# Patient Record
Sex: Male | Born: 1985 | Race: Black or African American | Hispanic: No | Marital: Married | Smoking: Current every day smoker
Health system: Southern US, Community
[De-identification: ages and names within clinical notes are randomized; demographics above are authoritative.]

## PROBLEM LIST (undated history)

## (undated) HISTORY — PX: HERNIA REPAIR: SHX51

---

## 2017-05-08 ENCOUNTER — Emergency Department (HOSPITAL_COMMUNITY)
Admission: EM | Admit: 2017-05-08 | Discharge: 2017-05-08 | Disposition: A | Discharge status: Home / Self Care | Payer: Self-pay | Attending: Emergency Medicine | Admitting: Emergency Medicine

## 2017-05-08 ENCOUNTER — Emergency Department (HOSPITAL_COMMUNITY): Payer: Self-pay

## 2017-05-08 ENCOUNTER — Encounter (HOSPITAL_COMMUNITY): Payer: Self-pay

## 2017-05-08 ENCOUNTER — Other Ambulatory Visit: Payer: Self-pay

## 2017-05-08 DIAGNOSIS — F17200 Nicotine dependence, unspecified, uncomplicated: Secondary | ICD-10-CM | POA: Insufficient documentation

## 2017-05-08 DIAGNOSIS — R200 Anesthesia of skin: Secondary | ICD-10-CM | POA: Insufficient documentation

## 2017-05-08 DIAGNOSIS — R079 Chest pain, unspecified: Secondary | ICD-10-CM

## 2017-05-08 LAB — BASIC METABOLIC PANEL
ANION GAP: 11 (ref 5–15)
BUN: 15 mg/dL (ref 6–20)
CALCIUM: 9.4 mg/dL (ref 8.9–10.3)
CHLORIDE: 107 mmol/L (ref 101–111)
CO2: 23 mmol/L (ref 22–32)
Creatinine, Ser: 1.06 mg/dL (ref 0.61–1.24)
GFR calc Af Amer: >60 mL/min (ref >60)
GFR calc non Af Amer: >60 mL/min (ref >60)
Glucose, Bld: 90 mg/dL (ref 65–99)
Potassium: 3.9 mmol/L (ref 3.5–5.1)
Sodium: 141 mmol/L (ref 135–145)

## 2017-05-08 LAB — CBC
HEMATOCRIT: 45.9 % (ref 39.0–52.0)
HEMOGLOBIN: 15.9 g/dL (ref 13.0–17.0)
MCH: 31.4 pg (ref 26.0–34.0)
MCHC: 34.6 g/dL (ref 30.0–36.0)
MCV: 90.5 fL (ref 78.0–100.0)
Platelets: 270 10*3/uL (ref 150–400)
RBC: 5.07 MIL/uL (ref 4.22–5.81)
RDW: 13.1 % (ref 11.5–15.5)
WBC: 5.5 10*3/uL (ref 4.0–10.5)

## 2017-05-08 LAB — I-STAT TROPONIN, ED: Troponin i, poc: 0 ng/mL (ref 0.00–0.08)

## 2017-05-08 NOTE — ED Triage Notes (Signed)
Patient complains of 1 week of intermittent chest pain with radiation to back and left arm. No SOB. Denies URI symptoms

## 2017-05-08 NOTE — ED Provider Notes (Signed)
[facility] EMERGENCY DEPARTMENT Provider Note   CSN: [id number] Arrival date & time: 05/08/17  1122   History   Chief Complaint Chief Complaint  Patient presents with  . Chest Pain    HPI Todd Little is a 32 y.o. male.  HPI   32 year old male presents today with complaints of chest pain.  Patient notes that he has had 1 week history of intermittent chest pain.  He notes at times is sharp, pressure.  He notes some numbness in his left arm that is intermittent.  Patient denies any cough, fever, shortness of breath.  He denies any exacerbating factors.  Patient notes he is a smoker and has been smoking more recently as his job is increasingly stressful working at the present.  She does note in February he had 2-week history of cough, no significant cough recently.  Patient also reporting he is having reflux.  He denies any history of DVT or PE, denies any significant risk factors or physical complaints.  Patient denies any cardiac history.    History reviewed. No pertinent past medical history.  There are no active problems to display for this patient.   History reviewed. No pertinent surgical history.     Home Medications    Prior to Admission medications   Not on File    Family History No family history on file.  Social History Social History   Tobacco Use  . Smoking status: Current Every Day Smoker  . Smokeless tobacco: Never Used  Substance Use Topics  . Alcohol use: Yes    Frequency: Never  . Drug use: No     Allergies   Patient has no known allergies.   Review of Systems Review of Systems  All other systems reviewed and are negative.   Physical Exam Updated Vital Signs BP (!) 135/95   Pulse (!) 56   Temp 98.4 F (36.9 C) (Oral)   Resp 17   SpO2 100%   Physical Exam  Constitutional: He is oriented to person, place, and time. He appears well-developed and well-nourished.  HENT:  Head: Normocephalic and atraumatic.    Eyes: Conjunctivae are normal. Pupils are equal, round, and reactive to light. Right eye exhibits no discharge. Left eye exhibits no discharge. No scleral icterus.  Neck: Normal range of motion. No JVD present. No tracheal deviation present.  Cardiovascular: Normal rate, regular rhythm, normal heart sounds and intact distal pulses. Exam reveals no gallop and no friction rub.  No murmur heard. Pulmonary/Chest: Effort normal and breath sounds normal. No stridor. No respiratory distress. He has no wheezes. He has no rales. He exhibits no tenderness.  Musculoskeletal: He exhibits no edema.  Neurological: He is alert and oriented to person, place, and time. Coordination normal.  Psychiatric: He has a normal mood and affect. His behavior is normal. Judgment and thought content normal.  Nursing note and vitals reviewed.   ED Treatments / Results  Labs (all labs ordered are listed, but only abnormal results are displayed) Labs Reviewed  BASIC METABOLIC PANEL  CBC  I-STAT TROPONIN, ED    EKG  EKG Interpretation None       Radiology Dg Chest 2 View  Result Date: 05/08/2017 CLINICAL DATA:  One week of intermittent chest pain radiating to the back and left arm, smoking history EXAM: CHEST  2 VIEW COMPARISON:  Chest x-ray of 05/17/2016 FINDINGS: No active infiltrate or effusion is seen. Mediastinal and hilar contours are unremarkable. The heart is within upper  limits of normal. No acute bony abnormality is seen. IMPRESSION: No active cardiopulmonary disease. Electronically Signed   By: Dwyane DeePaul  Barry M.D.   On: 05/08/2017 12:16    Procedures Procedures (including critical care time)  Medications Ordered in ED Medications - No data to display   Initial Impression / Assessment and Plan / ED Course  I have reviewed the triage vital signs and the nursing notes.  Pertinent labs & imaging results that were available during my care of the patient were reviewed by me and considered in my medical  decision making (see chart for details).      Final Clinical Impressions(s) / ED Diagnoses   Final diagnoses:  Nonspecific chest pain   Labs: I-STAT troponin, BMP, CBC  Imaging: DG chest 2 view  Consults:  Therapeutics:  Discharge Meds:   Assessment/Plan: 32 year old male presents today with complaints of chest pain.  This is been going on for 1 week, have very low suspicion for ACS PE dissection or any other acute anterior thoracic abnormality.  His pain changes in nature, he has no concerning signs or symptoms on his evaluation here today.  He has reassuring EKG negative troponin and a low heart score.  He is PERC negative.   ED Discharge Orders    None       Rosalio LoudHedges, Zerline Melchior, PA-C 05/08/17 1801    Benjiman CorePickering, Nathan, MD 05/08/17 2246

## 2017-05-08 NOTE — Discharge Instructions (Signed)
Please read attached information. If you experience any new or worsening signs or symptoms please return to the emergency room for evaluation. Please follow-up with your primary care provider or specialist as discussed.  °

## 2017-12-14 ENCOUNTER — Encounter: Payer: Self-pay | Admitting: Cardiology

## 2017-12-14 DIAGNOSIS — R9431 Abnormal electrocardiogram [ECG] [EKG]: Secondary | ICD-10-CM | POA: Insufficient documentation

## 2017-12-15 ENCOUNTER — Encounter: Payer: Self-pay | Admitting: Cardiology

## 2017-12-15 ENCOUNTER — Ambulatory Visit (INDEPENDENT_AMBULATORY_CARE_PROVIDER_SITE_OTHER): Payer: BC Managed Care – PPO | Admitting: Cardiology

## 2017-12-15 DIAGNOSIS — R9431 Abnormal electrocardiogram [ECG] [EKG]: Secondary | ICD-10-CM | POA: Diagnosis not present

## 2017-12-15 DIAGNOSIS — E782 Mixed hyperlipidemia: Secondary | ICD-10-CM | POA: Diagnosis not present

## 2017-12-15 DIAGNOSIS — F17210 Nicotine dependence, cigarettes, uncomplicated: Secondary | ICD-10-CM | POA: Insufficient documentation

## 2017-12-15 NOTE — Progress Notes (Addendum)
Cardiology Office Note:    Date:  12/15/2017   ID:  Todd Little, DOB October 19, 1985, MRN [id number]  PCP:  Baldo Ash, FNP  Cardiologist:  Garwin Brothers, MD   Referring MD: Baldo Ash, FNP    ASSESSMENT:    1. Abnormal EKG   2. Mixed dyslipidemia   3. Cigarette smoker    PLAN:    In order of problems listed above:  1. Primary prevention stressed with the patient.  Importance of compliance with diet and medication stressed and he vocalized understanding.  I did an extensive discussion with him about low lipid low-cholesterol diet and reducing his lipid numbers.  Importance of weight reduction was stressed and this of obesity explained and he vocalized understanding.  I will recheck his lab work in 6 months when I see him for follow-up visit. 2. In view of his abnormal EKG and chest discomfort he will undergo stress echocardiogram.  His chest discomfort is atypical for coronary etiology. 3. I spent 5 minutes with the patient discussing solely about smoking. Smoking cessation was counseled. I suggested to the patient also different medications and pharmacological interventions. Patient is keen to try stopping on its own at this time. He will get back to me if he needs any further assistance in this matter. 4. Patient will be seen in follow-up appointment in 6 months or earlier if the patient has any concerns    Medication Adjustments/Labs and Tests Ordered: Current medicines are reviewed at length with the patient today.  Concerns regarding medicines are outlined above.  No orders of the defined types were placed in this encounter.  No orders of the defined types were placed in this encounter.    History of Present Illness:    Todd Little is a 32 y.o. male who is being seen today for the evaluation of normal EKG and chest at the request of Baldo Ash, FNP.  Patient is a pleasant 32 year old male.  He has no significant past medical history.  Unfortunately he  is smoked since teenage.  And continues to smoke.  He is on Chantix and trying to reduce and stop smoking.  He was told that he occasionally has chest discomfort and this is not related to exertion.  No orthopnea or PND.  He is very concerned about his symptoms.  Review of lab work reveals that he has significantly elevated lipids.  Also he is overweight.  History reviewed. No pertinent past medical history.  Past Surgical History:  Procedure Laterality Date  . HERNIA REPAIR      Current Medications: No outpatient medications have been marked as taking for the 12/15/17 encounter (Office Visit) with Revankar, Aundra Dubin, MD.     Allergies:   Patient has no known allergies.   Social History   Socioeconomic History  . Marital status: Married    Spouse name: Not on file  . Number of children: Not on file  . Years of education: Not on file  . Highest education level: Not on file  Occupational History  . Not on file  Social Needs  . Financial resource strain: Not on file  . Food insecurity:    Worry: Not on file    Inability: Not on file  . Transportation needs:    Medical: Not on file    Non-medical: Not on file  Tobacco Use  . Smoking status: Current Every Day Smoker  . Smokeless tobacco: Never Used  Substance and Sexual Activity  . Alcohol use:  Yes    Frequency: Never  . Drug use: No  . Sexual activity: Not on file  Lifestyle  . Physical activity:    Days per week: Not on file    Minutes per session: Not on file  . Stress: Not on file  Relationships  . Social connections:    Talks on phone: Not on file    Gets together: Not on file    Attends religious service: Not on file    Active member of club or organization: Not on file    Attends meetings of clubs or organizations: Not on file    Relationship status: Not on file  Other Topics Concern  . Not on file  Social History Narrative  . Not on file     Family History: The patient's family history includes Diabetes  in his maternal grandfather and maternal grandmother; Hypertension in his father; Lung cancer in his father.  ROS:   Please see the history of present illness.    All other systems reviewed and are negative.  EKGs/Labs/Other Studies Reviewed:    The following studies were reviewed today: EKG reveals sinus bradycardia and nonspecific ST changes.   Recent Labs: 05/08/2017: BUN 15; Creatinine, Ser 1.06; Hemoglobin 15.9; Platelets 270; Potassium 3.9; Sodium 141  Recent Lipid Panel No results found for: CHOL, TRIG, HDL, CHOLHDL, VLDL, LDLCALC, LDLDIRECT  Physical Exam:    VS:  BP 122/76 (BP Location: Right Arm, Patient Position: Sitting, Cuff Size: Normal)   Pulse 77   Ht 5\' 6"  (1.676 m)   Wt 244 lb (110.7 kg)   SpO2 98%   BMI 39.38 kg/m     Wt Readings from Last 3 Encounters:  12/15/17 244 lb (110.7 kg)     GEN: Patient is in no acute distress HEENT: Normal NECK: No JVD; No carotid bruits LYMPHATICS: No lymphadenopathy CARDIAC: S1 S2 regular, 2/6 systolic murmur at the apex. RESPIRATORY:  Clear to auscultation without rales, wheezing or rhonchi  ABDOMEN: Soft, non-tender, non-distended MUSCULOSKELETAL:  No edema; No deformity  SKIN: Warm and dry NEUROLOGIC:  Alert and oriented x 3 PSYCHIATRIC:  Normal affect    Signed, Garwin Brothers, MD  12/15/2017 4:16 PM    [facility]

## 2017-12-15 NOTE — Addendum Note (Signed)
Addended by: Carren Rang on: 12/15/2017 04:39 PM   Modules accepted: Orders

## 2017-12-15 NOTE — Patient Instructions (Addendum)
Medication Instructions:  Your physician recommends that you continue on your current medications as directed. Please refer to the Current Medication list given to you today.  If you need a refill on your cardiac medications before your next appointment, please call your pharmacy.   Lab work: Your physician recommends that you return for lab work in: BMP,Hepatic function and Lipid profile  If you have labs (blood work) drawn today and your tests are completely normal, you will receive your results only by: Marland Kitchen MyChart Message (if you have MyChart) OR . A paper copy in the mail If you have any lab test that is abnormal or we need to change your treatment, we will call you to review the results.  Testing/Procedures: Your physician has requested that you have a stress echocardiogram. For further information please visit [email or web address] Please follow instruction sheet as given.    Follow-Up: At [facility], you and your health needs are our priority.  As part of our continuing mission to provide you with exceptional heart care, we have created designated Provider Care Teams.  These Care Teams include your primary Cardiologist (physician) and Advanced Practice Providers (APPs -  Physician Assistants and Nurse Practitioners) who all work together to provide you with the care you need, when you need it. You will need a follow up appointment in 6 months.  Please call our office 2 months in advance to schedule this appointment.  You may see No primary care provider on file. or another member of our [employer] Provider Team in [place]: Gypsy Balsam, MD . Norman Herrlich, MD  Any Other Special Instructions Will Be Listed Below (If Applicable).   Exercise Stress Echocardiogram An exercise stress echocardiogram is a test to check how well your heart is working. This test uses sound waves (ultrasound) and a computer to make images of your heart before and after exercise. Ultrasound images  that are taken before you exercise (your resting echocardiogram) will show how much blood is getting to your heart muscle and how well your heart muscle and heart valves are functioning. During the next part of this test, you will walk on a treadmill or ride a stationary bike to see how exercise affects your heart. While you exercise, the electrical activity of your heart will be monitored with an electrocardiogram (ECG). Your blood pressure will also be monitored. You may have this test if you:  Have chest pain or other symptoms of a heart problem.  Recently had a heart attack or heart surgery.  Have heart valve problems.  Have a condition that causes narrowing of the blood vessels that supply your heart (coronary artery disease).  Have a high risk of heart disease and are starting a new exercise program.  Have a high risk of heart disease and need to have major surgery.  Tell a health care provider about:  Any allergies you have.  All medicines you are taking, including vitamins, herbs, eye drops, creams, and over-the-counter medicines.  Any problems you or family members have had with anesthetic medicines.  Any blood disorders you have.  Any surgeries you have had.  Any medical conditions you have.  Whether you are pregnant or may be pregnant. What are the risks? Generally, this is a safe procedure. However, problems may occur, including:  Chest pain.  Dizziness or light-headedness.  Shortness of breath.  Increased or irregular heartbeat (palpitations).  Nausea or vomiting.  Heart attack (very rare).  What happens before the procedure?  Follow instructions from  your health care provider about eating or drinking restrictions. You may be asked to avoid all forms of caffeine for 24 hours before your procedure, or as told by your health care provider.  Ask your health care provider about changing or stopping your regular medicines. This is especially important if you  are taking diabetes medicines or blood thinners.  If you use an inhaler, bring it with you to the test.  Wear loose, comfortable clothing and walking shoes.  Do notuse any products that contain nicotine or tobacco, such as cigarettes and e-cigarettes, for 4 hours before the test or as told by your health care provider. If you need help quitting, ask your health care provider. What happens during the procedure?  You will take off your clothes from the waist up and put on a hospital gown.  A technician will place electrodes on your chest.  A blood pressure cuff will be placed on your arm.  You will lie down on a table for an ultrasound exam before you exercise. Gel will be rubbed on your chest, and a handheld device (transducer) will be pressed against your chest and moved over your heart.  Then, you will start exercising by walking on a treadmill or pedaling a stationary bicycle.  Your blood pressure and heart rhythm will be monitored while you exercise.  The exercise will gradually get harder or faster.  You will exercise until: ? Your heart reaches a target level. ? You are too tired to continue. ? You cannot continue because of chest pain, weakness, or dizziness.  You will have another ultrasound exam after you stop exercising. The procedure may vary among health care providers and hospitals. What happens after the procedure?  Your heart rate and blood pressure will be monitored until they return to your normal levels. Summary  An exercise stress echocardiogram is a test that uses ultrasound to check how well your heart works before and after exercise.  Before the test, follow instructions from your health care provider about stopping medications, avoiding nicotine and tobacco, and avoiding certain foods and drinks.  During the test, your blood pressure and heart rhythm will be monitored while you exercise on a treadmill or stationary bicycle. This information is not  intended to replace advice given to you by your health care provider. Make sure you discuss any questions you have with your health care provider. Document Released: 02/26/2004 Document Revised: 10/14/2015 Document Reviewed: 10/14/2015 Elsevier Interactive Patient Education  2018 [employer].

## 2017-12-21 ENCOUNTER — Ambulatory Visit: Payer: BC Managed Care – PPO | Admitting: Cardiology

## 2018-01-03 ENCOUNTER — Ambulatory Visit (INDEPENDENT_AMBULATORY_CARE_PROVIDER_SITE_OTHER): Payer: BC Managed Care – PPO

## 2018-01-03 DIAGNOSIS — R9431 Abnormal electrocardiogram [ECG] [EKG]: Secondary | ICD-10-CM | POA: Diagnosis not present

## 2018-01-03 NOTE — Progress Notes (Signed)
Stress echocardiogram with limited exam has been performed.   Jimmy Betsabe Iglesia RDCS, RVT 

## 2018-01-09 ENCOUNTER — Ambulatory Visit: Payer: BC Managed Care – PPO | Admitting: Cardiology

## 2018-06-26 ENCOUNTER — Telehealth: Payer: Self-pay | Admitting: Cardiology

## 2018-06-26 NOTE — Telephone Encounter (Signed)
Virtual Visit Pre-Appointment Phone Call  "(Name), I am calling you today to discuss your upcoming appointment. We are currently trying to limit exposure to the virus that causes COVID-19 by seeing patients at home rather than in the office."  1. "What is the BEST phone number to call the day of the visit?" - include this in appointment notes  2. Do you have or have access to (through a family member/friend) a smartphone with video capability that we can use for your visit?" a. If yes - list this number in appt notes as cell (if different from BEST phone #) and list the appointment type as a VIDEO visit in appointment notes b. If no - list the appointment type as a PHONE visit in appointment notes  3. Confirm consent - "In the setting of the current Covid19 crisis, you are scheduled for a (phone or video) visit with your provider on (date) at (time).  Just as we do with many in-office visits, in order for you to participate in this visit, we must obtain consent.  If you'd like, I can send this to your mychart (if signed up) or email for you to review.  Otherwise, I can obtain your verbal consent now.  All virtual visits are billed to your insurance company just like a normal visit would be.  By agreeing to a virtual visit, we'd like you to understand that the technology does not allow for your provider to perform an examination, and thus may limit your provider's ability to fully assess your condition. If your provider identifies any concerns that need to be evaluated in person, we will make arrangements to do so.  Finally, though the technology is pretty good, we cannot assure that it will always work on either your or our end, and in the setting of a video visit, we may have to convert it to a phone-only visit.  In either situation, we cannot ensure that we have a secure connection.  Are you willing to proceed?" STAFF: Did the patient verbally acknowledge consent to telehealth visit? Document  YES/NO here: YES 4.   5. Advise patient to be prepared - "Two hours prior to your appointment, go ahead and check your blood pressure, pulse, oxygen saturation, and your weight (if you have the equipment to check those) and write them all down. When your visit starts, your provider will ask you for this information. If you have an Apple Watch or Kardia device, please plan to have heart rate information ready on the day of your appointment. Please have a pen and paper handy nearby the day of the visit as well."  6. Give patient instructions for MyChart download to smartphone OR Doximity/Doxy.me as below if video visit (depending on what platform provider is using)  7. Inform patient they will receive a phone call 15 minutes prior to their appointment time (may be from unknown caller ID) so they should be prepared to answer    TELEPHONE CALL NOTE  Praneeth A Strimple has been deemed a candidate for a follow-up tele-health visit to limit community exposure during the Covid-19 pandemic. I spoke with the patient via phone to ensure availability of phone/video source, confirm preferred email & phone number, and discuss instructions and expectations.  I reminded Todd Little to be prepared with any vital sign and/or heart rhythm information that could potentially be obtained via home monitoring, at the time of his visit. I reminded Todd Little to expect a phone call  prior to his visit.  Minette BrineLisa B Welch 06/26/2018 11:48 AM   INSTRUCTIONS FOR DOWNLOADING THE MYCHART APP TO SMARTPHONE  - The patient must first make sure to have activated MyChart and know their login information - If Apple, go to [employer] Store and type in MyChart in the search bar and download the app. If Android, ask patient to go to [employer] Play Store and type in [facility] in the search bar and download the app. The app is free but as with any other app downloads, their phone may require them to verify saved payment information or  Apple/Android password.  - The patient will need to then log into the app with their MyChart username and password, and select [facility] as their healthcare provider to link the account. When it is time for your visit, go to the MyChart app, find appointments, and click Begin Video Visit. Be sure to Select Allow for your device to access the Microphone and Camera for your visit. You will then be connected, and your provider will be with you shortly.  **If they have any issues connecting, or need assistance please contact MyChart service desk [phone])**  **If using a computer, in order to ensure the best quality for their visit they will need to use either of the following Internet Browsers: [employer] Edge, or Google Chrome**  IF USING DOXIMITY or DOXY.ME - The patient will receive a link just prior to their visit by text.     FULL LENGTH CONSENT FOR TELE-HEALTH VISIT   I hereby voluntarily request, consent and authorize [facility] and its employed or contracted physicians, physician assistants, nurse practitioners or other licensed health care professionals (the Practitioner), to provide me with telemedicine health care services (the Services") as deemed necessary by the treating Practitioner. I acknowledge and consent to receive the Services by the Practitioner via telemedicine. I understand that the telemedicine visit will involve communicating with the Practitioner through live audiovisual communication technology and the disclosure of certain medical information by electronic transmission. I acknowledge that I have been given the opportunity to request an in-person assessment or other available alternative prior to the telemedicine visit and am voluntarily participating in the telemedicine visit.  I understand that I have the right to withhold or withdraw my consent to the use of telemedicine in the course of my care at any time, without affecting my right to future care  or treatment, and that the Practitioner or I may terminate the telemedicine visit at any time. I understand that I have the right to inspect all information obtained and/or recorded in the course of the telemedicine visit and may receive copies of available information for a reasonable fee.  I understand that some of the potential risks of receiving the Services via telemedicine include:   Delay or interruption in medical evaluation due to technological equipment failure or disruption;  Information transmitted may not be sufficient (e.g. poor resolution of images) to allow for appropriate medical decision making by the Practitioner; and/or   In rare instances, security protocols could fail, causing a breach of personal health information.  Furthermore, I acknowledge that it is my responsibility to provide information about my medical history, conditions and care that is complete and accurate to the best of my ability. I acknowledge that Practitioner's advice, recommendations, and/or decision may be based on factors not within their control, such as incomplete or inaccurate data provided by me or distortions of diagnostic images or specimens that may result from electronic  transmissions. I understand that the practice of medicine is not an exact science and that Practitioner makes no warranties or guarantees regarding treatment outcomes. I acknowledge that I will receive a copy of this consent concurrently upon execution via email to the email address I last provided but may also request a printed copy by calling the office of [facility].    I understand that my insurance will be billed for this visit.   I have read or had this consent read to me.  I understand the contents of this consent, which adequately explains the benefits and risks of the Services being provided via telemedicine.   I have been provided ample opportunity to ask questions regarding this consent and the Services and have had  my questions answered to my satisfaction.  I give my informed consent for the services to be provided through the use of telemedicine in my medical care  By participating in this telemedicine visit I agree to the above.

## 2018-06-29 ENCOUNTER — Telehealth: Payer: BC Managed Care – PPO | Admitting: Cardiology

## 2018-06-29 ENCOUNTER — Telehealth: Payer: Self-pay

## 2018-06-29 NOTE — Telephone Encounter (Signed)
Called patient for appointment and never answered so will continue efforts.

## 2019-01-01 ENCOUNTER — Other Ambulatory Visit: Payer: Self-pay

## 2019-01-01 DIAGNOSIS — Z20822 Contact with and (suspected) exposure to covid-19: Secondary | ICD-10-CM

## 2019-01-03 LAB — NOVEL CORONAVIRUS, NAA: SARS-CoV-2, NAA: NOT DETECTED

## 2019-01-08 ENCOUNTER — Other Ambulatory Visit: Payer: Self-pay

## 2019-01-08 DIAGNOSIS — Z20822 Contact with and (suspected) exposure to covid-19: Secondary | ICD-10-CM

## 2019-01-09 LAB — NOVEL CORONAVIRUS, NAA: SARS-CoV-2, NAA: NOT DETECTED

## 2019-01-18 ENCOUNTER — Other Ambulatory Visit: Payer: Self-pay

## 2019-01-18 DIAGNOSIS — Z20822 Contact with and (suspected) exposure to covid-19: Secondary | ICD-10-CM

## 2019-01-20 ENCOUNTER — Telehealth: Payer: Self-pay | Admitting: *Deleted

## 2019-01-20 NOTE — Telephone Encounter (Signed)
Wife called in to check status of pt's covid 19 result. Not available at this time. She voiced understanding.

## 2019-01-21 LAB — NOVEL CORONAVIRUS, NAA: SARS-CoV-2, NAA: NOT DETECTED

## 2019-07-17 IMAGING — CR DG CHEST 2V
2 series · 2 of 2 positions shown · non-contrast
Comparison: Chest x-ray of 05/17/2016

CLINICAL DATA: One week of intermittent chest pain radiating to the
back and left arm, smoking history

EXAM:
CHEST  2 VIEW

[chest pa]
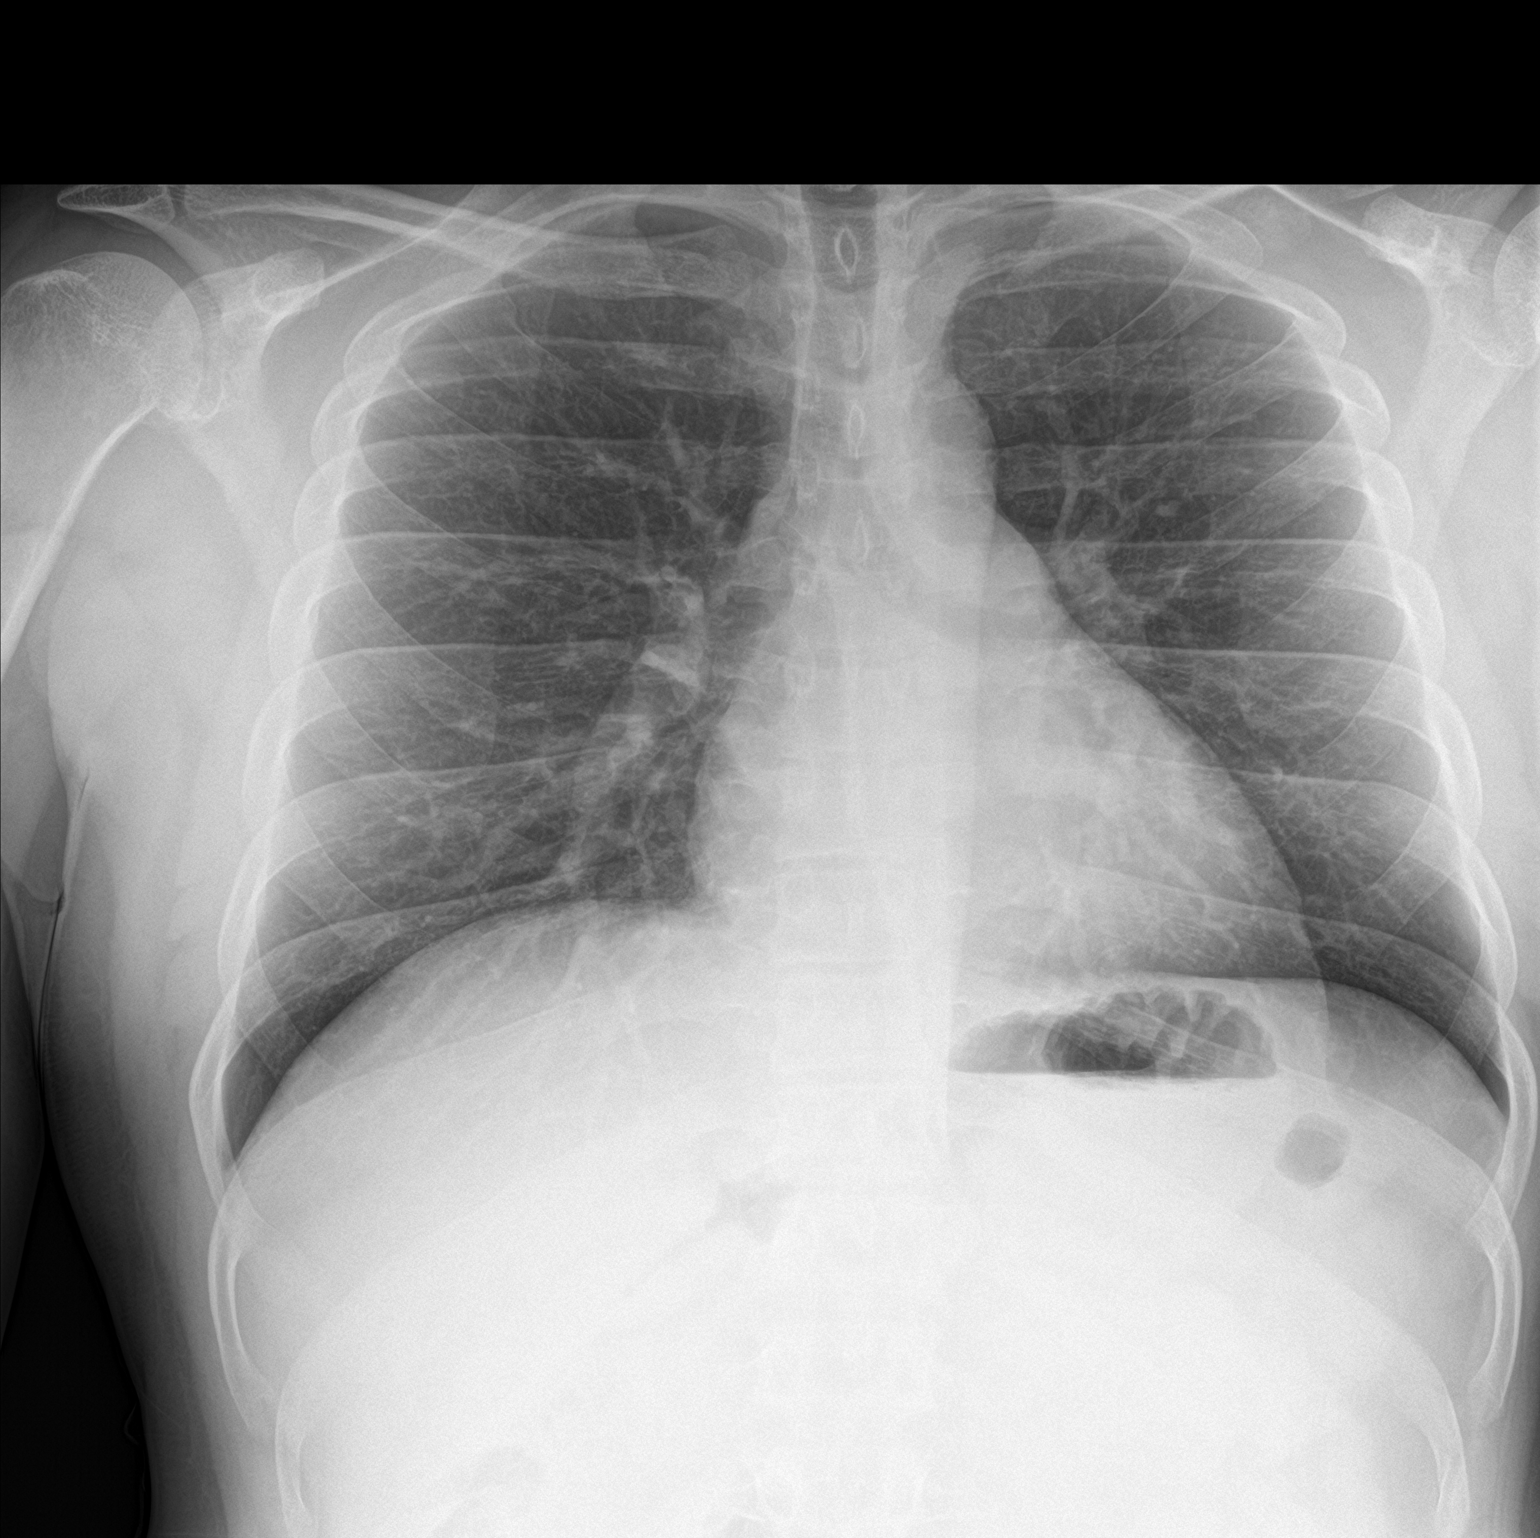

[chest lat]
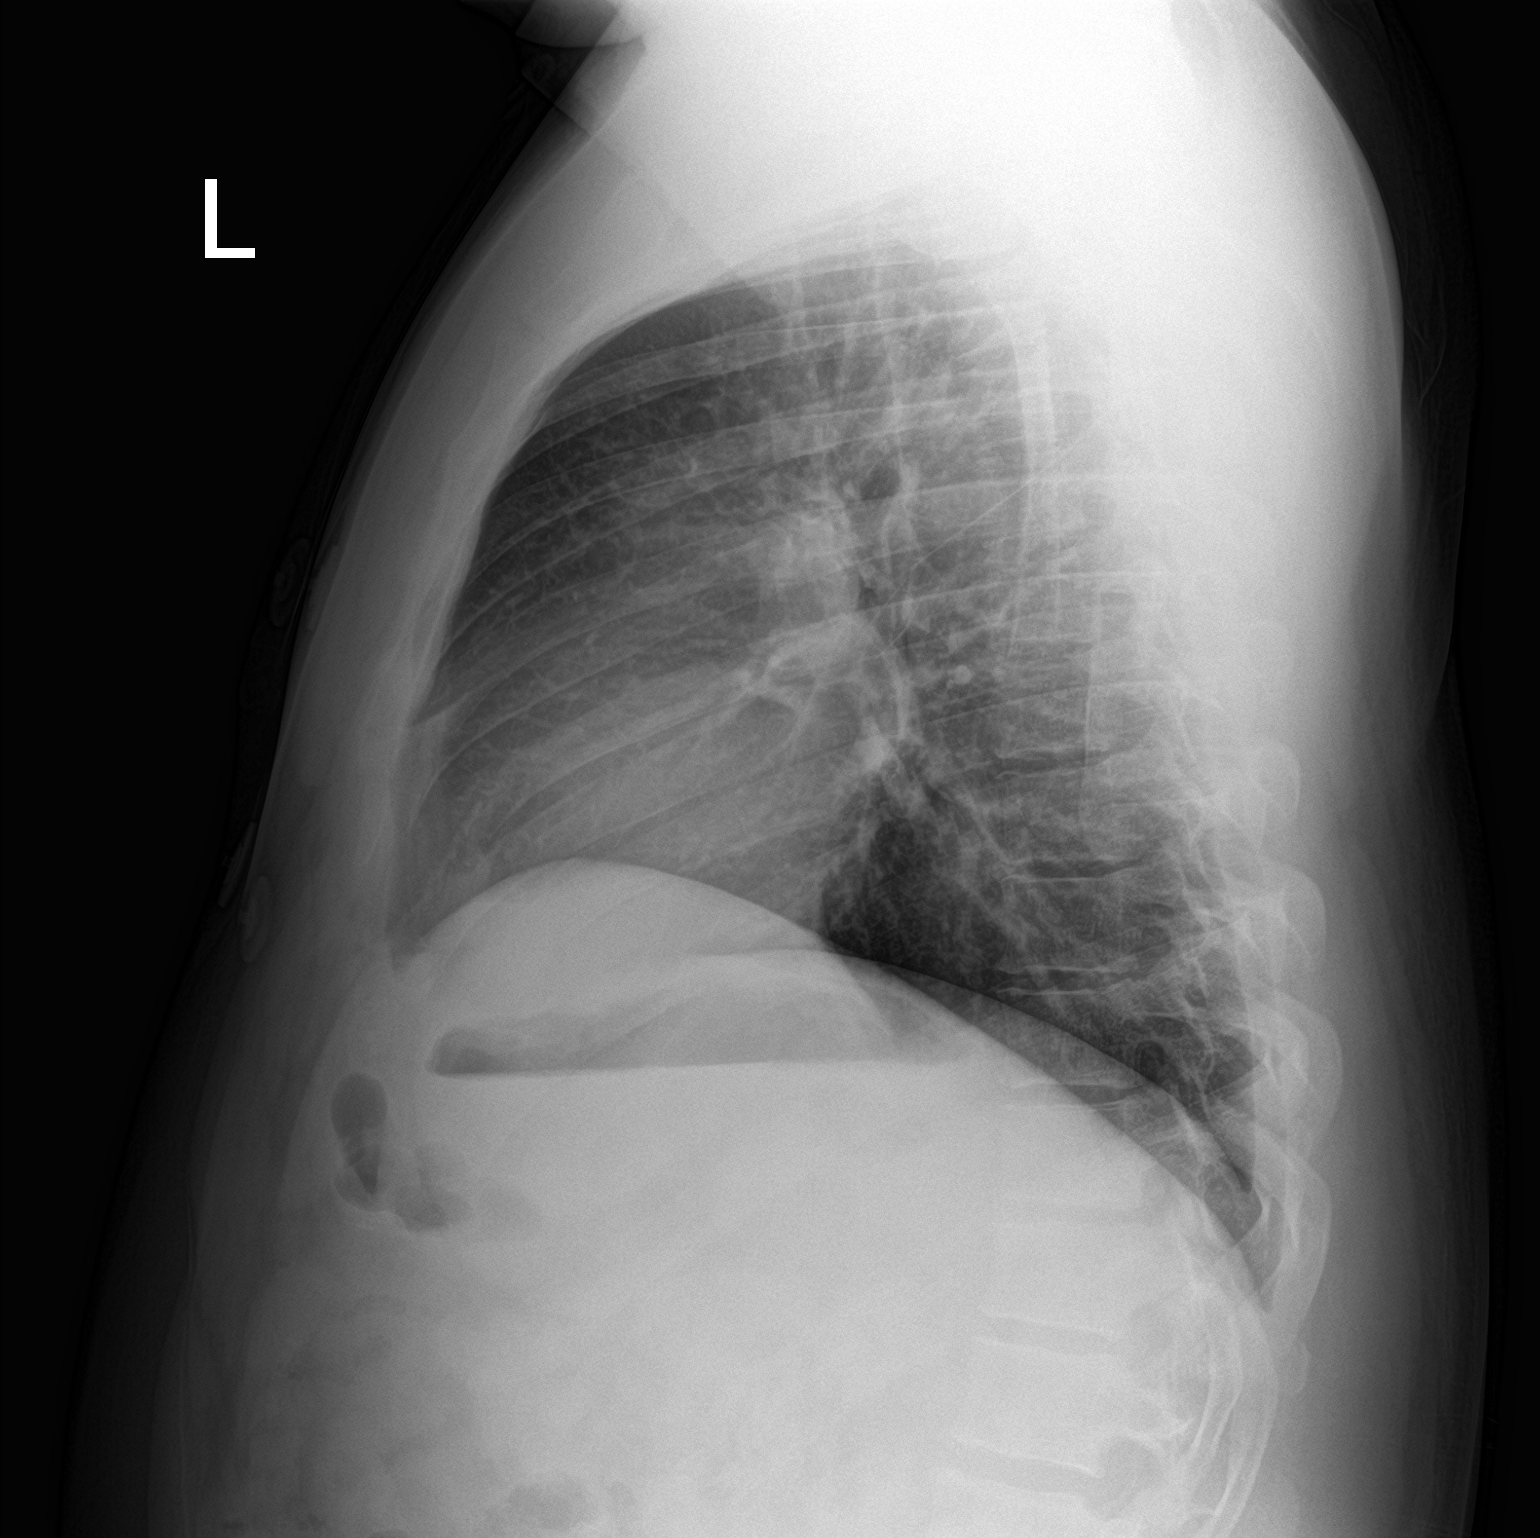

[2 of 2 positions shown; findings below may reference images not displayed]

FINDINGS: No active infiltrate or effusion is seen. Mediastinal and hilar
contours are unremarkable. The heart is within upper limits of
normal. No acute bony abnormality is seen.
IMPRESSION: No active cardiopulmonary disease.

## 2023-10-17 ENCOUNTER — Other Ambulatory Visit: Payer: Self-pay

## 2023-10-17 ENCOUNTER — Emergency Department (HOSPITAL_COMMUNITY)
Admission: EM | Admit: 2023-10-17 | Discharge: 2023-10-17 | Disposition: A | Discharge status: Home / Self Care | Payer: Self-pay | Attending: Emergency Medicine | Admitting: Emergency Medicine

## 2023-10-17 ENCOUNTER — Emergency Department (HOSPITAL_COMMUNITY): Payer: Self-pay

## 2023-10-17 ENCOUNTER — Encounter (HOSPITAL_COMMUNITY): Payer: Self-pay

## 2023-10-17 DIAGNOSIS — R072 Precordial pain: Secondary | ICD-10-CM

## 2023-10-17 DIAGNOSIS — R058 Other specified cough: Secondary | ICD-10-CM

## 2023-10-17 DIAGNOSIS — R059 Cough, unspecified: Secondary | ICD-10-CM | POA: Insufficient documentation

## 2023-10-17 DIAGNOSIS — Z720 Tobacco use: Secondary | ICD-10-CM | POA: Insufficient documentation

## 2023-10-17 LAB — BASIC METABOLIC PANEL WITH GFR
Anion gap: 9 (ref 5–15)
BUN: 13 mg/dL (ref 6–20)
CO2: 22 mmol/L (ref 22–32)
Calcium: 8.8 mg/dL — ABNORMAL LOW (ref 8.9–10.3)
Chloride: 105 mmol/L (ref 98–111)
Creatinine, Ser: 0.9 mg/dL (ref 0.61–1.24)
GFR, Estimated: >60 mL/min (ref >60)
Glucose, Bld: 111 mg/dL — ABNORMAL HIGH (ref 70–99)
Potassium: 3.8 mmol/L (ref 3.5–5.1)
Sodium: 136 mmol/L (ref 135–145)

## 2023-10-17 LAB — CBC
HCT: 43.2 % (ref 39.0–52.0)
Hemoglobin: 14.5 g/dL (ref 13.0–17.0)
MCH: 31.6 pg (ref 26.0–34.0)
MCHC: 33.6 g/dL (ref 30.0–36.0)
MCV: 94.1 fL (ref 80.0–100.0)
Platelets: 262 K/uL (ref 150–400)
RBC: 4.59 MIL/uL (ref 4.22–5.81)
RDW: 12.8 % (ref 11.5–15.5)
WBC: 5 K/uL (ref 4.0–10.5)
nRBC: 0 % (ref 0.0–0.2)

## 2023-10-17 LAB — RESP PANEL BY RT-PCR (RSV, FLU A&B, COVID)  RVPGX2
Influenza A by PCR: NEGATIVE
Influenza B by PCR: NEGATIVE
Resp Syncytial Virus by PCR: NEGATIVE
SARS Coronavirus 2 by RT PCR: NEGATIVE

## 2023-10-17 LAB — TROPONIN I (HIGH SENSITIVITY): Troponin I (High Sensitivity): 5 ng/L (ref <18)

## 2023-10-17 NOTE — ED Notes (Signed)
 Pt leaving for home, in no new onset distress at this time. No questions after reviewing paper work.

## 2023-10-17 NOTE — ED Notes (Signed)
 Phlebotomy asked to stick

## 2023-10-17 NOTE — ED Provider Notes (Signed)
 [place] EMERGENCY DEPARTMENT AT [facility] Provider Note   CSN: [id number] Arrival date & time: 10/17/23  9144     Patient presents with: Chest Pain   Todd Little is a 38 y.o. male.   Pt with c/o mid to left chest pain at rest in past few days. Dull, non radiating, indicates constant in past day, now currently resolved. No pleuritic pain. +non prod cough, +nasal congestion. No fever or chills. No known ill contact. Denies exertional chest pain or discomfort. No unusual doe or fatigue. No personal or fam hx cad. No associated nv, diaphoresis or sob. No heartburn. Denies leg pain or swelling. No hx dvt or pe. +smoker.   The history is provided by the patient and medical records.  Chest Pain Associated symptoms: cough   Associated symptoms: no abdominal pain, no back pain, no fever, no headache, no nausea, no palpitations, no shortness of breath and no vomiting        Prior to Admission medications   Not on File    Allergies: Patient has no known allergies.    Review of Systems  Constitutional:  Negative for chills and fever.  HENT:  Positive for congestion. Negative for sore throat.   Eyes:  Negative for redness.  Respiratory:  Positive for cough. Negative for shortness of breath.   Cardiovascular:  Positive for chest pain. Negative for palpitations and leg swelling.  Gastrointestinal:  Negative for abdominal pain, nausea and vomiting.  Genitourinary:  Negative for flank pain.  Musculoskeletal:  Negative for back pain and neck pain.  Neurological:  Negative for headaches.    Updated Vital Signs BP (!) 138/93   Pulse (!) 52   Temp 98 F (36.7 C) (Oral)   Resp 18   Ht 1.702 m (5' 7)   Wt 92.5 kg   SpO2 100%   BMI 31.95 kg/m   Physical Exam Vitals and nursing note reviewed.  Constitutional:      Appearance: Normal appearance. He is well-developed.  HENT:     Head: Atraumatic.     Nose: Congestion present.     Mouth/Throat:     Mouth: Mucous  membranes are moist.     Pharynx: Oropharynx is clear.  Eyes:     General: No scleral icterus.    Conjunctiva/sclera: Conjunctivae normal.  Neck:     Trachea: No tracheal deviation.  Cardiovascular:     Rate and Rhythm: Normal rate and regular rhythm.     Pulses: Normal pulses.     Heart sounds: Normal heart sounds. No murmur heard.    No friction rub. No gallop.  Pulmonary:     Effort: Pulmonary effort is normal. No accessory muscle usage or respiratory distress.     Breath sounds: Normal breath sounds.  Chest:     Chest wall: No tenderness.  Abdominal:     General: Bowel sounds are normal. There is no distension.     Palpations: Abdomen is soft.     Tenderness: There is no abdominal tenderness. There is no guarding.  Musculoskeletal:        General: No swelling or tenderness.     Cervical back: Normal range of motion and neck supple. No rigidity.     Right lower leg: No edema.     Left lower leg: No edema.  Skin:    General: Skin is warm and dry.     Findings: No rash.  Neurological:     Mental Status: He is alert.  Comments: Alert, speech clear.   Psychiatric:        Mood and Affect: Mood normal.     (all labs ordered are listed, but only abnormal results are displayed) Results for orders placed or performed during the hospital encounter of 10/17/23  Basic metabolic panel   Collection Time: 10/17/23  9:09 AM  Result Value Ref Range   Sodium 136 135 - 145 mmol/L   Potassium 3.8 3.5 - 5.1 mmol/L   Chloride 105 98 - 111 mmol/L   CO2 22 22 - 32 mmol/L   Glucose, Bld 111 (H) 70 - 99 mg/dL   BUN 13 6 - 20 mg/dL   Creatinine, Ser 9.09 0.61 - 1.24 mg/dL   Calcium 8.8 (L) 8.9 - 10.3 mg/dL   GFR, Estimated >39 >39 mL/min   Anion gap 9 5 - 15  CBC   Collection Time: 10/17/23  9:09 AM  Result Value Ref Range   WBC 5.0 4.0 - 10.5 K/uL   RBC 4.59 4.22 - 5.81 MIL/uL   Hemoglobin 14.5 13.0 - 17.0 g/dL   HCT 56.7 60.9 - 47.9 %   MCV 94.1 80.0 - 100.0 fL   MCH 31.6  26.0 - 34.0 pg   MCHC 33.6 30.0 - 36.0 g/dL   RDW 87.1 88.4 - 84.4 %   Platelets 262 150 - 400 K/uL   nRBC 0.0 0.0 - 0.2 %  Troponin I (High Sensitivity)   Collection Time: 10/17/23  9:09 AM  Result Value Ref Range   Troponin I (High Sensitivity) 5 <18 ng/L  Resp panel by RT-PCR (RSV, Flu A&B, Covid) Anterior Nasal Swab   Collection Time: 10/17/23 11:45 AM   Specimen: Anterior Nasal Swab  Result Value Ref Range   SARS Coronavirus 2 by RT PCR NEGATIVE NEGATIVE   Influenza A by PCR NEGATIVE NEGATIVE   Influenza B by PCR NEGATIVE NEGATIVE   Resp Syncytial Virus by PCR NEGATIVE NEGATIVE   DG Chest 2 View Result Date: 10/17/2023 CLINICAL DATA:  CP EXAM: CHEST - 2 VIEW COMPARISON:  Chest x-ray 11/19/2018 FINDINGS: The heart and mediastinal contours are within normal limits. No focal consolidation. No pulmonary edema. No pleural effusion. No pneumothorax. No acute osseous abnormality. IMPRESSION: No active cardiopulmonary disease. Electronically Signed   By: Morgane  Naveau M.D.   On: 10/17/2023 11:01     EKG: EKG Interpretation Date/Time:  Tuesday October 17 2023 09:08:15 EDT Ventricular Rate:  81 PR Interval:  198 QRS Duration:  88 QT Interval:  346 QTC Calculation: 401 R Axis:   75  Text Interpretation: Normal sinus rhythm Non-specific ST-t changes Confirmed by Bernard Drivers ([id number]) on 10/17/2023 12:30:50 PM  Radiology: [facility] Chest 2 View Result Date: 10/17/2023 CLINICAL DATA:  CP EXAM: CHEST - 2 VIEW COMPARISON:  Chest x-ray 11/19/2018 FINDINGS: The heart and mediastinal contours are within normal limits. No focal consolidation. No pulmonary edema. No pleural effusion. No pneumothorax. No acute osseous abnormality. IMPRESSION: No active cardiopulmonary disease. Electronically Signed   By: Morgane  Naveau M.D.   On: 10/17/2023 11:01     Procedures   Medications Ordered in the ED - No data to display                                  Medical Decision Making Problems  Addressed: Non-productive cough: acute illness or injury Precordial chest pain: acute illness or injury with systemic symptoms that poses a  threat to life or bodily functions Tobacco use: chronic illness or injury that poses a threat to life or bodily functions  Amount and/or Complexity of Data Reviewed External Data Reviewed: notes. Labs: ordered. Decision-making details documented in ED Course. Radiology: ordered and independent interpretation performed. Decision-making details documented in ED Course. ECG/medicine tests: ordered and independent interpretation performed. Decision-making details documented in ED Course.  Risk Decision regarding hospitalization.   Iv ns. Continuous pulse ox and cardiac monitoring. Labs ordered/sent. Imaging ordered.   Differential diagnosis includes acs, msk cp, gi cp, etc. Dispo decision including potential need for admission considered - will get labs and imaging and reassess.   Reviewed nursing notes and prior charts for additional history. External reports reviewed.  Cardiac monitor: sinus rhythm, rate 80.  Labs reviewed/interpreted by me - wbc and hgb normal. Trop normal - after symptoms for past few days, felt not c/w acs.  Covid/flu neg.   Xrays reviewed/interpreted by me - no pna.   Pt remains chest pain free and appears stable for ED d/c.   Rec close pcp/card f/u.  Return precautions provided.       Final diagnoses:  Precordial chest pain    ED Discharge Orders          Ordered    Ambulatory referral to Cardiology       Comments: If you have not heard from the Cardiology office within the next 72 hours please call [phone].   10/17/23 1406               Bernard Drivers, MD 10/17/23 1408

## 2023-10-17 NOTE — Discharge Instructions (Signed)
 It was our pleasure to provide your ER care today - we hope that you feel better.  If GI/reflux symptoms, try taking pepcid and maalox as need for symptom relief.   For recent chest pain, follow up closely with cardiologist in the next 1-2 weeks - we made referral, and they should be contacting you with an appointment in the next few days.   Return to ER right away if worse, new symptoms, fevers, recurrent/persistent chest pain, increased trouble breathing, or other concern.

## 2023-10-17 NOTE — ED Triage Notes (Signed)
 Pt came in via POV d/t 3 days ago feeling central CP intermittently & this morning it was worse & feels like a dull pain that radiates into his back as well. A/Ox4, rates his pain 7/10.

## 2023-10-28 ENCOUNTER — Ambulatory Visit
Admission: EM | Admit: 2023-10-28 | Discharge: 2023-10-28 | Disposition: A | Discharge status: Home / Self Care | Payer: Self-pay | Attending: Emergency Medicine | Admitting: Emergency Medicine

## 2023-10-28 ENCOUNTER — Encounter: Payer: Self-pay | Admitting: Emergency Medicine

## 2023-10-28 DIAGNOSIS — R030 Elevated blood-pressure reading, without diagnosis of hypertension: Secondary | ICD-10-CM | POA: Insufficient documentation

## 2023-10-28 DIAGNOSIS — K644 Residual hemorrhoidal skin tags: Secondary | ICD-10-CM | POA: Insufficient documentation

## 2023-10-28 DIAGNOSIS — Z113 Encounter for screening for infections with a predominantly sexual mode of transmission: Secondary | ICD-10-CM | POA: Insufficient documentation

## 2023-10-28 MED ORDER — HYDROCORTISONE (PERIANAL) 2.5 % EX CREA
1.0000 | TOPICAL_CREAM | Freq: Four times a day (QID) | CUTANEOUS | 0 refills | Status: AC
Start: 2023-10-28 — End: ?

## 2023-10-28 NOTE — ED Provider Notes (Signed)
 [facility]    CSN: [id number] Arrival date & time: 10/28/23  1302      History   Chief Complaint Chief Complaint  Patient presents with   Hemorrhoids    HPI Todd Little is a 38 y.o. male.  Here with concerns for hemorrhoids Reports had them all his life The are burning and itching. He has never tried interventions Normal BM. Denies straining. No blood.  Truck driver, sits a lot.  He does report anal intercourse 2 weeks ago and wants to be tested. 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 He had recent penile and oral STD testing, and also had blood work beginning of the month.   Patient very anxious about variety of symptoms Does not have PCP  History reviewed. No pertinent past medical history.  Patient Active Problem List   Diagnosis Date Noted   Mixed dyslipidemia 12/15/2017   Cigarette smoker 12/15/2017   Abnormal EKG 12/14/2017    Past Surgical History:  Procedure Laterality Date   HERNIA REPAIR         Home Medications    Prior to Admission medications   Medication Sig Start Date End Date Taking? Authorizing Provider  hydrocortisone  (ANUSOL -HC) 2.5 % rectal cream Place 1 Application rectally 4 (four) times daily. For 1-2 weeks 10/28/23  Yes Montzerrat Brunell, Asberry, PA-C    Family History Family History  Problem Relation Age of Onset   Diabetes Maternal Grandmother    Lung cancer Father    Hypertension Father    Diabetes Maternal Grandfather     Social History Social History   Tobacco Use   Smoking status: Every Day   Smokeless tobacco: Never  Substance Use Topics   Alcohol use: Yes   Drug use: No     Allergies   Patient has no known allergies.   Review of Systems Review of Systems  As per HPI  Physical Exam Triage Vital Signs ED Triage Vitals  Encounter Vitals Group     BP 10/28/23 1332 (!) 149/100     Girls Systolic BP Percentile --      Girls Diastolic BP Percentile --      Boys Systolic BP Percentile --      Boys  Diastolic BP Percentile --      Pulse Rate 10/28/23 1332 82     Resp 10/28/23 1332 18     Temp 10/28/23 1332 98.2 F (36.8 C)     Temp Source 10/28/23 1332 Oral     SpO2 10/28/23 1332 96 %     Weight --      Height --      Head Circumference --      Peak Flow --      Pain Score 10/28/23 1333 5     Pain Loc --      Pain Education --      Exclude from Growth Chart --    No data found.  Updated Vital Signs BP (!) 149/100 (BP Location: Left Arm)   Pulse 82   Temp 98.2 F (36.8 C) (Oral)   Resp 18   SpO2 96%    Physical Exam Vitals and nursing note reviewed. Exam conducted with a chaperone present Rick Fields RN).  Constitutional:      General: He is not in acute distress.    Appearance: Normal appearance. He is not ill-appearing.  HENT:     Mouth/Throat:     Mouth: Mucous membranes are moist.     Pharynx: Oropharynx is  clear.  Eyes:     Conjunctiva/sclera: Conjunctivae normal.  Cardiovascular:     Rate and Rhythm: Normal rate and regular rhythm.     Pulses: Normal pulses.     Heart sounds: Normal heart sounds.  Pulmonary:     Effort: Pulmonary effort is normal.     Breath sounds: Normal breath sounds.  Abdominal:     Palpations: Abdomen is soft.     Tenderness: There is no abdominal tenderness.  Genitourinary:    Rectum: External hemorrhoid present.     Comments: Small external hemorrhoids on exam. Non tender. Normal rectal tone. No other lesions or ulcerations. No fissures, bleeding, or discharge. Anal cytology swab is collected by this provider. Musculoskeletal:        General: Normal range of motion.     Cervical back: Normal range of motion.  Skin:    General: Skin is warm and dry.  Neurological:     General: No focal deficit present.     Mental Status: He is alert and oriented to person, place, and time.     Cranial Nerves: No cranial nerve deficit.     Sensory: No sensory deficit.     Motor: No weakness.     Coordination: Coordination normal.     Gait:  Gait normal.     UC Treatments / Results  Labs (all labs ordered are listed, but only abnormal results are displayed) Labs Reviewed  CYTOLOGY, (ORAL, ANAL, URETHRAL) ANCILLARY ONLY    EKG  Radiology No results found.  Procedures Procedures (including critical care time)  Medications Ordered in UC Medications - No data to display  Initial Impression / Assessment and Plan / UC Course  I have reviewed the triage vital signs and the nursing notes.  Pertinent labs & imaging results that were available during my care of the patient were reviewed by me and considered in my medical decision making (see chart for details).  External hemorrhoids Long discussion with patient about this Further info in AVS Try Anusol  QID. Other supportive care is advised.  Cytology swab of anus is pending  Elevated BP without prior HTN dx Today 149/100 No red flags Likely anxiety is contributing, and patient reports he was worried about having an anal exam. Advised symptoms to monitor for.  Information for community health and wellness is given, advised to call first thing Monday to set up a primary care appointment.  We discussed the benefits of having a primary care and of having annual visits. They will also be able to monitor BP and manage as needed.  Discharged after lengthy discussion and all questions were answered.  Final Clinical Impressions(s) / UC Diagnoses   Final diagnoses:  External hemorrhoids  Screen for STD (sexually transmitted disease)  Elevated BP without diagnosis of hypertension     Discharge Instructions      Please read the attached information about hemorrhoids You currently have a few small ones. Anusol  cream applied rectally, 4 times daily. This is to reduce itching and burning symptoms, and can shrink the size of them. Avoid prolonged sitting, and any straining.   We will call you if anything on your swab returns positive. You can also see these results on  MyChart. Please abstain from sexual intercourse until your results return. We do not call with negative results.   Please call the community health and wellness clinic to make an appointment to establish with a primary care provider.      ED Prescriptions  Medication Sig Dispense Auth. Provider   hydrocortisone  (ANUSOL -HC) 2.5 % rectal cream Place 1 Application rectally 4 (four) times daily. For 1-2 weeks 30 g Hernando Reali, Asberry, PA-C      PDMP not reviewed this encounter.   Jeryl Asberry, PA-C 10/28/23 1635

## 2023-10-28 NOTE — Discharge Instructions (Addendum)
 Please read the attached information about hemorrhoids You currently have a few small ones. Anusol  cream applied rectally, 4 times daily. This is to reduce itching and burning symptoms, and can shrink the size of them. Avoid prolonged sitting, and any straining.   We will call you if anything on your swab returns positive. You can also see these results on MyChart. Please abstain from sexual intercourse until your results return. We do not call with negative results.   Please [facility] to make an appointment to establish with a primary care provider.

## 2023-10-28 NOTE — ED Triage Notes (Addendum)
 Pt c/o hemorrhoids with itching and burning   Pt also c/o lower abd pain off and on since Aug 5

## 2023-10-30 LAB — CYTOLOGY, (ORAL, ANAL, URETHRAL) ANCILLARY ONLY
Chlamydia: NEGATIVE
Comment: NEGATIVE
Comment: NORMAL
Neisseria Gonorrhea: NEGATIVE
